# Patient Record
Sex: Male | Born: 1990 | Race: White | Hispanic: No | Marital: Single | Smoking: Never smoker
Health system: Southern US, Community
[De-identification: ages and names within clinical notes are randomized; demographics above are authoritative.]

---

## 2014-08-06 ENCOUNTER — Ambulatory Visit (INDEPENDENT_AMBULATORY_CARE_PROVIDER_SITE_OTHER): Payer: BLUE CROSS/BLUE SHIELD | Admitting: Neurology

## 2014-08-06 ENCOUNTER — Ambulatory Visit (INDEPENDENT_AMBULATORY_CARE_PROVIDER_SITE_OTHER): Payer: Self-pay | Admitting: Neurology

## 2014-08-06 DIAGNOSIS — R202 Paresthesia of skin: Secondary | ICD-10-CM

## 2014-08-06 DIAGNOSIS — R200 Anesthesia of skin: Secondary | ICD-10-CM

## 2014-08-06 NOTE — Progress Notes (Signed)
  [facility]    Provider:  Dr Lucia GaskinsAhern Referring Provider: Richardean Chimeraaniel, Terry G, MD Primary Care Physician:  No primary care provider on file.   HPI:  Shawn Ortega is a 24 y.o. male here as a referral from Dr. Reuel Boomaniel for numbness in the medial lower right leg in the are of the ankle.   Summary  Nerve conduction studies were performed on the right lower extremity:  The right Peroneal motor nerve showed normal conductions with normal F Wave latency The right Tibial motor nerve showed normal conductions with normal F Wave latency The right Sural sensory nerve conduction was within normal limits The right Peroneal sensory nerve conduction was within normal limits The right Saphenous sensory nerve conduction was within normal limits Bilateral H Reflexes showed normal latencies  EMG Needle study was performed on selected right lower extremity and paraspinal muscles:   The Gluteus Medius, Gluteus Maximus, Biceps Femoris (long and short heads), Vastus Medialis, Anterior Tibialis, Medial Gastrocnemius, Posterior Tibialis, Extensor Hallucis Longus, abductor digiti quinti, and Abductor Hallucis muscles, L5/S1 paraspinals were within normal limits.  Conclusion: This is a normal study. No electrophysiologic evidence for peripheral polyneuropathy, mononeuropathy or radiculopathy.   Naomie DeanAntonia Chan Rosasco, MD  [facility] [address] [place], [place] [id number]  Phone [phone] Fax [phone]

## 2014-08-06 NOTE — Procedures (Signed)
  [facility]    Provider:  Dr Ahern Referring Provider: Daniel, Terry G, MD Primary Care Physician:  No primary care provider on file.   HPI:  Shawn Ortega is a 24 y.o. male here as a referral from Dr. Daniel for numbness in the medial lower right leg in the are of the ankle.   Summary  Nerve conduction studies were performed on the right lower extremity:  The right Peroneal motor nerve showed normal conductions with normal F Wave latency The right Tibial motor nerve showed normal conductions with normal F Wave latency The right Sural sensory nerve conduction was within normal limits The right Peroneal sensory nerve conduction was within normal limits The right Saphenous sensory nerve conduction was within normal limits Bilateral H Reflexes showed normal latencies  EMG Needle study was performed on selected right lower extremity and paraspinal muscles:   The Gluteus Medius, Gluteus Maximus, Biceps Femoris (long and short heads), Vastus Medialis, Anterior Tibialis, Medial Gastrocnemius, Posterior Tibialis, Extensor Hallucis Longus, abductor digiti quinti, and Abductor Hallucis muscles, L5/S1 paraspinals were within normal limits.  Conclusion: This is a normal study. No electrophysiologic evidence for peripheral polyneuropathy, mononeuropathy or radiculopathy.   Antonia Ahern, MD  [facility] [address] [place], [place] [address]  Phone [phone] Fax [phone]  

## 2014-08-06 NOTE — Progress Notes (Signed)
See procedure note.

## 2019-10-12 ENCOUNTER — Encounter (HOSPITAL_COMMUNITY): Payer: Self-pay

## 2019-10-12 ENCOUNTER — Emergency Department (HOSPITAL_COMMUNITY): Payer: Worker's Compensation

## 2019-10-12 ENCOUNTER — Emergency Department (HOSPITAL_COMMUNITY)
Admission: EM | Admit: 2019-10-12 | Discharge: 2019-10-12 | Disposition: A | Discharge status: Home / Self Care | Payer: Worker's Compensation | Attending: Emergency Medicine | Admitting: Emergency Medicine

## 2019-10-12 DIAGNOSIS — Y929 Unspecified place or not applicable: Secondary | ICD-10-CM | POA: Diagnosis not present

## 2019-10-12 DIAGNOSIS — S20219A Contusion of unspecified front wall of thorax, initial encounter: Secondary | ICD-10-CM | POA: Diagnosis not present

## 2019-10-12 DIAGNOSIS — R0789 Other chest pain: Secondary | ICD-10-CM | POA: Insufficient documentation

## 2019-10-12 DIAGNOSIS — Y939 Activity, unspecified: Secondary | ICD-10-CM | POA: Insufficient documentation

## 2019-10-12 DIAGNOSIS — W11XXXA Fall on and from ladder, initial encounter: Secondary | ICD-10-CM | POA: Insufficient documentation

## 2019-10-12 DIAGNOSIS — S0990XA Unspecified injury of head, initial encounter: Secondary | ICD-10-CM | POA: Diagnosis not present

## 2019-10-12 DIAGNOSIS — Y999 Unspecified external cause status: Secondary | ICD-10-CM | POA: Diagnosis not present

## 2019-10-12 MED ORDER — IOHEXOL 300 MG/ML  SOLN
75.0000 mL | Freq: Once | INTRAMUSCULAR | Status: AC | PRN
  Administered 2019-10-12: 75 mL via INTRAVENOUS

## 2019-10-12 NOTE — Discharge Instructions (Signed)
Your x-rays are totally normal, no signs of fractures or trauma, Tylenol or ibuprofen for pain, rest for 24 hours, drink plenty liquids, emergency department for severe or worsening symptoms.

## 2019-10-12 NOTE — ED Triage Notes (Addendum)
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 Pt fell 10 ft off ladder. Reports nozzle got away and he grabbed it causing him to lose balance and fell off to right side No loss of consciousness. Reports pain in ribcage.Marland Kitchen Helmet broke has red areas on forehead and top of head

## 2019-10-12 NOTE — ED Provider Notes (Signed)
[facility] EMERGENCY DEPARTMENT Provider Note   CSN: [id number] Arrival date & time: 10/12/19  1102     History Chief Complaint  Patient presents with   Shawn Ortega is a 29 y.o. male.  HPI   This patient is a 29 year old male presenting after a fall off of a ladder from approximately 10 feet.  He fell backwards into the side landing primarily on his side but also striking his head significantly on the ground breaking the helmet that he was wearing.  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  He did not lose consciousness but the onlookers said that he was not quite himself, he was confused after the fall but now back to his normal self.  He complains of a headache, forehead pain, mid chest pain and side rib pain but no spine pain numbness or weakness, he was ambulatory at the scene and was brought here by private vehicle.  Ambulatory through the doors.  He declines pain medications, pain is worse with deep breathing and moving  History reviewed. No pertinent past medical history.  There are no problems to display for this patient.   History reviewed. No pertinent surgical history.     No family history on file.  Social History   Tobacco Use   Smoking status: Never Smoker   Smokeless tobacco: Current User    Types: Chew  Substance Use Topics   Alcohol use: Never   Drug use: Never    Home Medications Prior to Admission medications   Not on File    Allergies    Penicillins  Review of Systems   Review of Systems  All other systems reviewed and are negative.   Physical Exam Updated Vital Signs BP 112/62    Pulse 80    Temp 98.4 F (36.9 C) (Oral)    Resp 15    Ht 1.829 m (6')    Wt 69.4 kg    SpO2 99%    BMI 20.75 kg/m   Physical Exam Vitals and nursing note reviewed.  Constitutional:      General: He is not in acute distress.    Appearance: He is well-developed.  HENT:     Head: Normocephalic.      Comments: Bruising to the mid forehead    Mouth/Throat:     Pharynx: No oropharyngeal exudate.  Eyes:     General: No scleral icterus.       Right eye: No discharge.        Left eye: No discharge.     Conjunctiva/sclera: Conjunctivae normal.     Pupils: Pupils are equal, round, and reactive to light.  Neck:     Thyroid: No thyromegaly.     Vascular: No JVD.  Cardiovascular:     Rate and Rhythm: Normal rate and regular rhythm.     Heart sounds: Normal heart sounds. No murmur heard.  No friction rub. No gallop.   Pulmonary:     Effort: Pulmonary effort is normal. No respiratory distress.     Breath sounds: Normal breath sounds. No wheezing or rales.     Comments: Able to take a deep breath with normal lung sounds, has mild chest tenderness over the sternum and the bilateral ribs but no crepitance or subcutaneous emphysema Chest:     Chest wall: Tenderness present.  Abdominal:     General: Bowel sounds are normal. There is no distension.     Palpations:  Abdomen is soft. There is no mass.     Tenderness: There is no abdominal tenderness.     Comments: No bruising to the abdominal wall, no tenderness  Musculoskeletal:        General: No tenderness. Normal range of motion.     Cervical back: Normal range of motion and neck supple.     Comments: Soft compartments and supple joints diffusely  Lymphadenopathy:     Cervical: No cervical adenopathy.  Skin:    General: Skin is warm and dry.     Findings: No erythema or rash.  Neurological:     Mental Status: He is alert.     Coordination: Coordination normal.     Comments: Moves all 4 extremities with normal strength, normal sensation and coordination.  Cranial nerves III through XII are normal  Psychiatric:        Behavior: Behavior normal.     ED Results / Procedures / Treatments   Labs (all labs ordered are listed, but only abnormal results are displayed) Labs Reviewed - No data to display  EKG None  Radiology CT Head Wo  Contrast  Result Date: 10/12/2019 CLINICAL DATA:  Pain following fall from roof EXAM: CT HEAD WITHOUT CONTRAST CT CERVICAL SPINE WITHOUT CONTRAST TECHNIQUE: Multidetector CT imaging of the head and cervical spine was performed following the standard protocol without intravenous contrast. Multiplanar CT image reconstructions of the cervical spine were also generated. COMPARISON:  None. FINDINGS: CT HEAD FINDINGS Brain: Ventricles and sulci are normal in size and configuration. There is no intracranial mass, hemorrhage, extra-axial fluid collection, or midline shift. The brain parenchyma appears unremarkable. No acute infarct evident. Vascular: No hyperdense vessel.  No evident vascular calcification. Skull: Bony calvarium appears intact. Sinuses/Orbits: There is opacification in a left ethmoid air cell. Other visualized paranasal sinuses are clear. Orbits appear symmetric bilaterally. Other: Mastoid air cells are clear. CT CERVICAL SPINE FINDINGS Alignment: There is no evidence spondylolisthesis. Skull base and vertebrae: Skull base and craniocervical junction regions appear normal. No fracture evident. No blastic or lytic bone lesions. Soft tissues and spinal canal: Prevertebral soft tissues and predental space regions are normal. There is no evident cord or canal hematoma. No paraspinous lesions are evident. Disc levels: The disc spaces appear unremarkable. No nerve root edema or effacement. No disc extrusion or stenosis. There is slight exit foraminal narrowing on the left at C2-3 due to bony hypertrophy. Upper chest: Visualized upper lung regions are clear. Other: None IMPRESSION: CT head: Mild ethmoid sinus disease.  Study otherwise unremarkable. CT cervical spine: No fracture or spondylolisthesis. No disc extrusion or stenosis. Exit foraminal narrowing on the left at C2-3 due to bony hypertrophy noted. Electronically Signed   By: Bretta Bang III M.D.   On: 10/12/2019 12:31   CT Chest W  Contrast  Result Date: 10/12/2019 CLINICAL DATA:  Firefighter, fall from ladder, rib pain and shortness of breath EXAM: CT CHEST WITH CONTRAST TECHNIQUE: Multidetector CT imaging of the chest was performed during intravenous contrast administration. CONTRAST:  64mL OMNIPAQUE IOHEXOL 300 MG/ML  SOLN COMPARISON:  None. FINDINGS: Cardiovascular: No significant vascular findings. Normal heart size. No pericardial effusion. Mediastinum/Nodes: No enlarged mediastinal, hilar, or axillary lymph nodes. Thyroid gland, trachea, and esophagus demonstrate no significant findings. Lungs/Pleura: Lungs are clear. No pleural effusion or pneumothorax. Upper Abdomen: No acute abnormality. Musculoskeletal: Slight curvature of the spine suggesting scoliosis. No acute osseous finding. No chest wall abnormality or soft tissue asymmetry. Intact sternum. IMPRESSION: No acute  intrathoracic injury or finding. Mild scoliosis. Electronically Signed   By: Jerilynn Mages.  Shick M.D.   On: 10/12/2019 12:31   CT Cervical Spine Wo Contrast  Result Date: 10/12/2019 CLINICAL DATA:  Pain following fall from roof EXAM: CT HEAD WITHOUT CONTRAST CT CERVICAL SPINE WITHOUT CONTRAST TECHNIQUE: Multidetector CT imaging of the head and cervical spine was performed following the standard protocol without intravenous contrast. Multiplanar CT image reconstructions of the cervical spine were also generated. COMPARISON:  None. FINDINGS: CT HEAD FINDINGS Brain: Ventricles and sulci are normal in size and configuration. There is no intracranial mass, hemorrhage, extra-axial fluid collection, or midline shift. The brain parenchyma appears unremarkable. No acute infarct evident. Vascular: No hyperdense vessel.  No evident vascular calcification. Skull: Bony calvarium appears intact. Sinuses/Orbits: There is opacification in a left ethmoid air cell. Other visualized paranasal sinuses are clear. Orbits appear symmetric bilaterally. Other: Mastoid air cells are clear. CT  CERVICAL SPINE FINDINGS Alignment: There is no evidence spondylolisthesis. Skull base and vertebrae: Skull base and craniocervical junction regions appear normal. No fracture evident. No blastic or lytic bone lesions. Soft tissues and spinal canal: Prevertebral soft tissues and predental space regions are normal. There is no evident cord or canal hematoma. No paraspinous lesions are evident. Disc levels: The disc spaces appear unremarkable. No nerve root edema or effacement. No disc extrusion or stenosis. There is slight exit foraminal narrowing on the left at C2-3 due to bony hypertrophy. Upper chest: Visualized upper lung regions are clear. Other: None IMPRESSION: CT head: Mild ethmoid sinus disease.  Study otherwise unremarkable. CT cervical spine: No fracture or spondylolisthesis. No disc extrusion or stenosis. Exit foraminal narrowing on the left at C2-3 due to bony hypertrophy noted. Electronically Signed   By: Lowella Grip III M.D.   On: 10/12/2019 12:31    Procedures Procedures (including critical care time)  Medications Ordered in ED Medications  iohexol (OMNIPAQUE) 300 MG/ML solution 75 mL (75 mLs Intravenous Contrast Given 10/12/19 1210)    ED Course  I have reviewed the triage vital signs and the nursing notes.  Pertinent labs & imaging results that were available during my care of the patient were reviewed by me and considered in my medical decision making (see chart for details).    MDM Rules/Calculators/A&P                          Imaging to rule out significant fracture or intracranial hemorrhage spinal injury or injury trauma to the ribs or sternum.  The patient is agreeable, vital signs are reassuring, mechanism suggest there could be underlying injury despite the patient's stoic appearance.  He declines pain medication  X-rays unremarkable including no signs of trauma to the chest the cervical spine of the head, the patient is stable for discharge on an  anti-inflammatory, reassurance given  Final Clinical Impression(s) / ED Diagnoses Final diagnoses:  Minor head injury, initial encounter  Fall from ladder, initial encounter  Contusion of chest wall, unspecified laterality, initial encounter    Rx / DC Orders ED Discharge Orders    None       Noemi Chapel, MD 10/12/19 1322

## 2020-10-17 ENCOUNTER — Encounter (HOSPITAL_COMMUNITY): Payer: Self-pay | Admitting: Emergency Medicine

## 2020-10-17 ENCOUNTER — Other Ambulatory Visit: Payer: Self-pay

## 2020-10-17 ENCOUNTER — Emergency Department (HOSPITAL_COMMUNITY): Payer: No Typology Code available for payment source

## 2020-10-17 ENCOUNTER — Emergency Department (HOSPITAL_COMMUNITY)
Admission: EM | Admit: 2020-10-17 | Discharge: 2020-10-17 | Disposition: A | Discharge status: Home / Self Care | Payer: No Typology Code available for payment source | Attending: Emergency Medicine | Admitting: Emergency Medicine

## 2020-10-17 DIAGNOSIS — R072 Precordial pain: Secondary | ICD-10-CM | POA: Insufficient documentation

## 2020-10-17 DIAGNOSIS — R112 Nausea with vomiting, unspecified: Secondary | ICD-10-CM | POA: Diagnosis not present

## 2020-10-17 DIAGNOSIS — Z20822 Contact with and (suspected) exposure to covid-19: Secondary | ICD-10-CM | POA: Diagnosis not present

## 2020-10-17 DIAGNOSIS — F17220 Nicotine dependence, chewing tobacco, uncomplicated: Secondary | ICD-10-CM | POA: Diagnosis not present

## 2020-10-17 DIAGNOSIS — R42 Dizziness and giddiness: Secondary | ICD-10-CM | POA: Diagnosis not present

## 2020-10-17 DIAGNOSIS — R0789 Other chest pain: Secondary | ICD-10-CM

## 2020-10-17 LAB — BASIC METABOLIC PANEL
Anion gap: 7 (ref 5–15)
BUN: 19 mg/dL (ref 6–20)
CO2: 27 mmol/L (ref 22–32)
Calcium: 9 mg/dL (ref 8.9–10.3)
Chloride: 105 mmol/L (ref 98–111)
Creatinine, Ser: 1.11 mg/dL (ref 0.61–1.24)
GFR, Estimated: >60 mL/min (ref >60)
Glucose, Bld: 91 mg/dL (ref 70–99)
Potassium: 3.9 mmol/L (ref 3.5–5.1)
Sodium: 139 mmol/L (ref 135–145)

## 2020-10-17 LAB — CBC WITH DIFFERENTIAL/PLATELET
Abs Immature Granulocytes: 0.02 10*3/uL (ref 0.00–0.07)
Basophils Absolute: 0 10*3/uL (ref 0.0–0.1)
Basophils Relative: 1 %
Eosinophils Absolute: 0 10*3/uL (ref 0.0–0.5)
Eosinophils Relative: 0 %
HCT: 42.6 % (ref 39.0–52.0)
Hemoglobin: 15.1 g/dL (ref 13.0–17.0)
Immature Granulocytes: 0 %
Lymphocytes Relative: 19 %
Lymphs Abs: 1 10*3/uL (ref 0.7–4.0)
MCH: 30.8 pg (ref 26.0–34.0)
MCHC: 35.4 g/dL (ref 30.0–36.0)
MCV: 86.8 fL (ref 80.0–100.0)
Monocytes Absolute: 0.4 10*3/uL (ref 0.1–1.0)
Monocytes Relative: 7 %
Neutro Abs: 3.8 10*3/uL (ref 1.7–7.7)
Neutrophils Relative %: 73 %
Platelets: 227 10*3/uL (ref 150–400)
RBC: 4.91 MIL/uL (ref 4.22–5.81)
RDW: 11.6 % (ref 11.5–15.5)
WBC: 5.3 10*3/uL (ref 4.0–10.5)
nRBC: 0 % (ref 0.0–0.2)

## 2020-10-17 LAB — RESP PANEL BY RT-PCR (FLU A&B, COVID) ARPGX2
Influenza A by PCR: NEGATIVE
Influenza B by PCR: NEGATIVE
SARS Coronavirus 2 by RT PCR: NEGATIVE

## 2020-10-17 LAB — TROPONIN I (HIGH SENSITIVITY)
Troponin I (High Sensitivity): <2 ng/L (ref <18)
Troponin I (High Sensitivity): <2 ng/L (ref <18)

## 2020-10-17 MED ORDER — LIDOCAINE VISCOUS HCL 2 % MT SOLN
15.0000 mL | Freq: Once | OROMUCOSAL | Status: AC
  Administered 2020-10-17: 15 mL via ORAL
  Filled 2020-10-17: qty 15

## 2020-10-17 MED ORDER — ALUM & MAG HYDROXIDE-SIMETH 200-200-20 MG/5ML PO SUSP
30.0000 mL | Freq: Once | ORAL | Status: AC
  Administered 2020-10-17: 30 mL via ORAL
  Filled 2020-10-17: qty 30

## 2020-10-17 MED ORDER — OMEPRAZOLE 20 MG PO CPDR
20.0000 mg | DELAYED_RELEASE_CAPSULE | Freq: Every day | ORAL | 0 refills | Status: AC
Start: 2020-10-17 — End: 2020-11-16

## 2020-10-17 NOTE — ED Provider Notes (Signed)
[facility] EMERGENCY DEPARTMENT Provider Note   CSN: [id number] Arrival date & time: 10/17/20  1343     History Chief Complaint  Patient presents with   Chest Pain    Shawn Ortega is a 30 y.o. male.  HPI  Patient with no significant medical history presents to the emergency department with chief complaint of intermittent chest pain.  Patient states pain started suddenly last night, describes it as a dull sensation which comes and goes, states that it last approximately 30 to 60 seconds and then resolve on its own, states it tends to get worse with eating or drinking, states he will feel his chest tightening up.  He states that chest pain is substernal, does not radiate.  He denies becoming short of breath, becoming diaphoretic, having associated nausea, vomiting, lightheadedness with these chest pain.  He denies history of DVTs or PEs, currently not on hormone therapy, he denies  cardiac history, he does not smoke, does not do illicit drug use, he is a nondiabetic.  States never had this in the past, he denies alleviating factors.  Patient denies headaches, fevers, chills, shortness of breath, abdominal pain, pedal edema.  History reviewed. No pertinent past medical history.  There are no problems to display for this patient.   History reviewed. No pertinent surgical history.     History reviewed. No pertinent family history.  Social History   Tobacco Use   Smoking status: Never   Smokeless tobacco: Current    Types: Chew  Vaping Use   Vaping Use: Never used  Substance Use Topics   Alcohol use: Never   Drug use: Never    Home Medications Prior to Admission medications   Medication Sig Start Date End Date Taking? Authorizing Provider  omeprazole (PRILOSEC) 20 MG capsule Take 1 capsule (20 mg total) by mouth daily. 10/17/20 11/16/20 Yes Carroll Sage, PA-C    Allergies    Penicillins  Review of Systems   Review of Systems  Constitutional:  Negative for  chills and fever.  HENT:  Negative for congestion.   Respiratory:  Negative for shortness of breath.   Cardiovascular:  Positive for chest pain. Negative for palpitations and leg swelling.  Gastrointestinal:  Negative for abdominal pain, diarrhea and vomiting.  Genitourinary:  Negative for enuresis.  Musculoskeletal:  Negative for back pain.  Skin:  Negative for rash.  Neurological:  Negative for dizziness.  Hematological:  Does not bruise/bleed easily.   Physical Exam Updated Vital Signs BP 122/78 (BP Location: Right Arm)   Pulse 74   Temp 98.7 F (37.1 C) (Oral)   Resp 15   Ht 5\' 11"  (1.803 m)   Wt 72.6 kg   SpO2 99%   BMI 22.32 kg/m   Physical Exam Vitals and nursing note reviewed.  Constitutional:      General: He is not in acute distress.    Appearance: He is not ill-appearing.  HENT:     Head: Normocephalic and atraumatic.     Nose: No congestion.  Eyes:     Conjunctiva/sclera: Conjunctivae normal.  Cardiovascular:     Rate and Rhythm: Normal rate and regular rhythm.     Pulses: Normal pulses.     Heart sounds: No murmur heard.   No friction rub. No gallop.     Comments: Chest was palpated cannot reproduce chest pain. Pulmonary:     Effort: No respiratory distress.     Breath sounds: No wheezing, rhonchi or rales.  Abdominal:  Palpations: Abdomen is soft.     Tenderness: There is abdominal tenderness. There is no right CVA tenderness or left CVA tenderness.     Comments: Abdomen was visualized nondistended, no active bowel sounds, dull to percussion, he had noted epigastric pain no rebound tenderness, guarding, peritoneal sign.  Musculoskeletal:     Right lower leg: No edema.     Left lower leg: No edema.  Skin:    General: Skin is warm and dry.  Neurological:     Mental Status: He is alert.  Psychiatric:        Mood and Affect: Mood normal.    ED Results / Procedures / Treatments   Labs (all labs ordered are listed, but only abnormal results are  displayed) Labs Reviewed  RESP PANEL BY RT-PCR (FLU A&B, COVID) [id number]  BASIC METABOLIC PANEL  CBC WITH DIFFERENTIAL/PLATELET  TROPONIN I (HIGH SENSITIVITY)    EKG EKG Interpretation  Date/Time:  Saturday October 17 2020 14:11:34 EDT Ventricular Rate:  81 PR Interval:  128 QRS Duration: 100 QT Interval:  330 QTC Calculation: 383 R Axis:   69 Text Interpretation: Normal sinus rhythm with sinus arrhythmia Normal ECG Confirmed by Eber Hong ([id number]) on 10/17/2020 3:19:41 PM  Radiology DG Chest Port 1 View  Result Date: 10/17/2020 CLINICAL DATA:  Chest pain EXAM: PORTABLE CHEST 1 VIEW COMPARISON:  Chest CT 10/12/2019 FINDINGS: The heart size and mediastinal contours are within normal limits. Both lungs are clear. The visualized skeletal structures are unremarkable. IMPRESSION: No active disease. Electronically Signed   By: Marlan Palau M.D.   On: 10/17/2020 15:15    Procedures Procedures   Medications Ordered in ED Medications  alum & mag hydroxide-simeth (MAALOX/MYLANTA) 200-200-20 MG/5ML suspension 30 mL (30 mLs Oral Given 10/17/20 1506)    And  lidocaine (XYLOCAINE) 2 % viscous mouth solution 15 mL (15 mLs Oral Given 10/17/20 1506)    ED Course  I have reviewed the triage vital signs and the nursing notes.  Pertinent labs & imaging results that were available during my care of the patient were reviewed by me and considered in my medical decision making (see chart for details).    MDM Rules/Calculators/A&P                         Initial impression-patient presents with intermittent chest pain.  He is alert, does not appear in acute distress, vital signs reassuring.  Suspect GERD.  Will obtain basic lab work-up, provide GI cocktail and reassess.  Work-up-CBC unremarkable, BMP unremarkable, respiratory panel negative, troponin less than 2, chest x-ray unremarkable, EKG sinus without signs of ischemia  Reassessment-patient was reassessed states she is feeling better after GI  cocktail, vital signs remained stable, patient has no complaints this time.  Patient is agreeable for discharge.  Rule out- I have low suspicion for ACS as history is atypical, patient has no cardiac history, EKG was sinus rhythm without signs of ischemia, first troponin is less than 2, will defer second troponin as patient has been having chest pain for greater than 12 hours, would expect elevation in troponin if ACS was present.  Low suspicion for PE as patient denies pleuritic chest pain, shortness of breath, patient denies leg pain, no pedal edema noted on exam, patient was PERC negative.  Low suspicion for AAA or aortic dissection as history is atypical, patient has low risk factors.  Low suspicion for CHF exacerbation as lung sounds were clear bilaterally, chest  x-ray negative, no signs of fluid overload present my exam.  Low suspicion for systemic infection as patient is nontoxic-appearing, vital signs reassuring, no obvious source infection noted on exam.   Plan-  Chest pain resolved-I suspect secondary due to acid reflux, possible he might be suffering from esophagus spasms, cervical angina, costochondritis.  Will start on a PPI, follow-up with PCP for further evaluation.  Vital signs have remained stable, no indication for hospital admission.  Patient given at home care as well strict return precautions.  Patient verbalized that they understood agreed to said plan.  Final Clinical Impression(s) / ED Diagnoses Final diagnoses:  Atypical chest pain    Rx / DC Orders ED Discharge Orders          Ordered    omeprazole (PRILOSEC) 20 MG capsule  Daily        10/17/20 1614             Barnie Del 10/17/20 1616    Eber Hong, MD 10/18/20 [phone]

## 2020-10-17 NOTE — Discharge Instructions (Addendum)
Lab Work and imaging were reassuring.  I suspect you maybe suffering from acid reflux, starting you on an acid pill please take as prescribed.  Also given you a healthy diet to help prevent future acid reflux flareups.  Please follow-up with your PCP as needed.  Come back to the emergency department if you develop chest pain, shortness of breath, severe abdominal pain, uncontrolled nausea, vomiting, diarrhea.

## 2020-10-17 NOTE — ED Triage Notes (Addendum)
Patient c/o intermittent, mid-sternal chest pain that started last night while laying in bed. Per patient pain does not radiating. Denies any shortness of breath, nausea, vomiting, or dizziness. Denies any cough or fevers. Per patient has generalized weakness. Denies any cardiac hx. Patient took x2 tylenol this morning at 5:45am with no relief.

## 2021-04-18 ENCOUNTER — Emergency Department (HOSPITAL_COMMUNITY)
Admission: EM | Admit: 2021-04-18 | Discharge: 2021-04-18 | Disposition: A | Discharge status: Home / Self Care | Payer: No Typology Code available for payment source | Attending: Emergency Medicine | Admitting: Emergency Medicine

## 2021-04-18 ENCOUNTER — Encounter (HOSPITAL_COMMUNITY): Payer: Self-pay | Admitting: *Deleted

## 2021-04-18 DIAGNOSIS — R1114 Bilious vomiting: Secondary | ICD-10-CM | POA: Diagnosis not present

## 2021-04-18 DIAGNOSIS — R1110 Vomiting, unspecified: Secondary | ICD-10-CM | POA: Diagnosis present

## 2021-04-18 DIAGNOSIS — R110 Nausea: Secondary | ICD-10-CM | POA: Insufficient documentation

## 2021-04-18 LAB — COMPREHENSIVE METABOLIC PANEL WITH GFR
ALT: 27 U/L (ref 0–44)
AST: 26 U/L (ref 15–41)
Albumin: 4.5 g/dL (ref 3.5–5.0)
Alkaline Phosphatase: 54 U/L (ref 38–126)
Anion gap: 10 (ref 5–15)
BUN: 17 mg/dL (ref 6–20)
CO2: 25 mmol/L (ref 22–32)
Calcium: 9.2 mg/dL (ref 8.9–10.3)
Chloride: 104 mmol/L (ref 98–111)
Creatinine, Ser: 1.2 mg/dL (ref 0.61–1.24)
GFR, Estimated: >60 mL/min
Glucose, Bld: 126 mg/dL — ABNORMAL HIGH (ref 70–99)
Potassium: 3.5 mmol/L (ref 3.5–5.1)
Sodium: 139 mmol/L (ref 135–145)
Total Bilirubin: 1.2 mg/dL (ref 0.3–1.2)
Total Protein: 6.9 g/dL (ref 6.5–8.1)

## 2021-04-18 LAB — CBC WITH DIFFERENTIAL/PLATELET
Abs Immature Granulocytes: 0.06 10*3/uL (ref 0.00–0.07)
Basophils Absolute: 0 10*3/uL (ref 0.0–0.1)
Basophils Relative: 0 %
Eosinophils Absolute: 0 10*3/uL (ref 0.0–0.5)
Eosinophils Relative: 0 %
HCT: 45.7 % (ref 39.0–52.0)
Hemoglobin: 16.2 g/dL (ref 13.0–17.0)
Immature Granulocytes: 1 %
Lymphocytes Relative: 3 %
Lymphs Abs: 0.3 10*3/uL — ABNORMAL LOW (ref 0.7–4.0)
MCH: 30.6 pg (ref 26.0–34.0)
MCHC: 35.4 g/dL (ref 30.0–36.0)
MCV: 86.2 fL (ref 80.0–100.0)
Monocytes Absolute: 0.5 10*3/uL (ref 0.1–1.0)
Monocytes Relative: 5 %
Neutro Abs: 8.8 10*3/uL — ABNORMAL HIGH (ref 1.7–7.7)
Neutrophils Relative %: 91 %
Platelets: 209 10*3/uL (ref 150–400)
RBC: 5.3 MIL/uL (ref 4.22–5.81)
RDW: 11.9 % (ref 11.5–15.5)
WBC: 9.7 10*3/uL (ref 4.0–10.5)
nRBC: 0 % (ref 0.0–0.2)

## 2021-04-18 LAB — URINALYSIS, ROUTINE W REFLEX MICROSCOPIC
Bacteria, UA: NONE SEEN
Bilirubin Urine: NEGATIVE
Glucose, UA: NEGATIVE mg/dL
Hgb urine dipstick: NEGATIVE
Ketones, ur: 5 mg/dL — AB
Leukocytes,Ua: NEGATIVE
Nitrite: NEGATIVE
Protein, ur: 30 mg/dL — AB
Specific Gravity, Urine: 1.024 (ref 1.005–1.030)
pH: 8 (ref 5.0–8.0)

## 2021-04-18 LAB — LIPASE, BLOOD: Lipase: 31 U/L (ref 11–51)

## 2021-04-18 MED ORDER — FAMOTIDINE IN NACL 20-0.9 MG/50ML-% IV SOLN
20.0000 mg | Freq: Once | INTRAVENOUS | Status: AC
  Administered 2021-04-18: 15:00:00 20 mg via INTRAVENOUS
  Filled 2021-04-18: qty 50

## 2021-04-18 MED ORDER — ONDANSETRON HCL 4 MG/2ML IJ SOLN
4.0000 mg | Freq: Once | INTRAMUSCULAR | Status: AC
  Administered 2021-04-18: 15:00:00 4 mg via INTRAVENOUS
  Filled 2021-04-18: qty 2

## 2021-04-18 MED ORDER — ONDANSETRON HCL 4 MG PO TABS
4.0000 mg | ORAL_TABLET | Freq: Four times a day (QID) | ORAL | 0 refills | Status: AC
Start: 2021-04-18 — End: ?

## 2021-04-18 MED ORDER — SODIUM CHLORIDE 0.9 % IV BOLUS
1000.0000 mL | Freq: Once | INTRAVENOUS | Status: AC
  Administered 2021-04-18: 15:00:00 1000 mL via INTRAVENOUS

## 2021-04-18 NOTE — ED Provider Notes (Signed)
[facility] EMERGENCY DEPARTMENT Provider Note   CSN: [id number] Arrival date & time: 04/18/21  1320     History Chief Complaint  Patient presents with   Emesis    NUR KRASINSKI is a 30 y.o. male presenting for evaluation of a 12-hour history of uncontrolled vomiting.  He states he went to a Christmas dinner last night where he ate catered food which tasted fine, however around 11 PM last night he started vomiting, initially he vomited his dinner, after that he has been having yellow vomit, denies hematemesis, has been unable to keep any p.o. medications or fluids down since his symptoms began.  He reports having a similar episode in the past, approximately 7 months ago of unclear etiology.  He was seen for this problem at that time and put on an acid reducer, although he denies have any problems with GERD or acid reflux on a routine basis.  He denies fevers or chills, denies abdominal pain, back pain, diarrhea, dysuria, however he does state his last urine production was last night shortly after he started vomiting.  He has had no medications prior to arrival.  Denies EtOH use.  The history is provided by the patient.      History reviewed. No pertinent past medical history.  There are no problems to display for this patient.   History reviewed. No pertinent surgical history.     No family history on file.  Social History   Tobacco Use   Smoking status: Never   Smokeless tobacco: Current    Types: Chew  Vaping Use   Vaping Use: Never used  Substance Use Topics   Alcohol use: Never   Drug use: Never    Home Medications Prior to Admission medications   Medication Sig Start Date End Date Taking? Authorizing Provider  ondansetron (ZOFRAN) 4 MG tablet Take 1 tablet (4 mg total) by mouth every 6 (six) hours. 04/18/21  Yes Clemon Devaul, Raynelle Fanning, PA-C  omeprazole (PRILOSEC) 20 MG capsule Take 1 capsule (20 mg total) by mouth daily. 10/17/20 11/16/20  Carroll Sage, PA-C     Allergies    Penicillins  Review of Systems   Review of Systems  Constitutional:  Negative for chills and fever.  HENT:  Negative for congestion and sore throat.   Eyes: Negative.   Respiratory:  Negative for chest tightness and shortness of breath.   Cardiovascular:  Negative for chest pain.  Gastrointestinal:  Positive for nausea and vomiting. Negative for abdominal pain and diarrhea.  Genitourinary: Negative.   Musculoskeletal:  Negative for arthralgias, joint swelling and neck pain.  Skin: Negative.  Negative for rash and wound.  Neurological:  Negative for dizziness, weakness, light-headedness, numbness and headaches.  Psychiatric/Behavioral: Negative.    All other systems reviewed and are negative.  Physical Exam Updated Vital Signs BP 114/64    Pulse 85    Temp 98 F (36.7 C) (Oral)    Resp 18    Ht 6' (1.829 m)    Wt 62.1 kg    SpO2 98%    BMI 18.58 kg/m   Physical Exam Vitals and nursing note reviewed.  Constitutional:      Appearance: He is well-developed.  HENT:     Head: Normocephalic and atraumatic.     Mouth/Throat:     Mouth: Mucous membranes are moist.  Eyes:     Conjunctiva/sclera: Conjunctivae normal.  Cardiovascular:     Rate and Rhythm: Normal rate and regular rhythm.  Heart sounds: Normal heart sounds.  Pulmonary:     Effort: Pulmonary effort is normal.     Breath sounds: Normal breath sounds. No wheezing.  Abdominal:     General: Bowel sounds are normal. There is no distension.     Palpations: Abdomen is soft.     Tenderness: There is no abdominal tenderness. There is no guarding or rebound.  Musculoskeletal:        General: Normal range of motion.     Cervical back: Normal range of motion.  Skin:    General: Skin is warm and dry.  Neurological:     Mental Status: He is alert.    ED Results / Procedures / Treatments   Labs (all labs ordered are listed, but only abnormal results are displayed) Labs Reviewed  CBC WITH  DIFFERENTIAL/PLATELET - Abnormal; Notable for the following components:      Result Value   Neutro Abs 8.8 (*)    Lymphs Abs 0.3 (*)    All other components within normal limits  COMPREHENSIVE METABOLIC PANEL - Abnormal; Notable for the following components:   Glucose, Bld 126 (*)    All other components within normal limits  URINALYSIS, ROUTINE W REFLEX MICROSCOPIC - Abnormal; Notable for the following components:   Ketones, ur 5 (*)    Protein, ur 30 (*)    All other components within normal limits  LIPASE, BLOOD    EKG None  Radiology No results found.  Procedures Procedures   Medications Ordered in ED Medications  ondansetron (ZOFRAN) injection 4 mg (4 mg Intravenous Given 04/18/21 1443)  sodium chloride 0.9 % bolus 1,000 mL (0 mLs Intravenous Stopped 04/18/21 1524)  famotidine (PEPCID) IVPB 20 mg premix (0 mg Intravenous Stopped 04/18/21 1524)    ED Course  I have reviewed the triage vital signs and the nursing notes.  Pertinent labs & imaging results that were available during my care of the patient were reviewed by me and considered in my medical decision making (see chart for details).    MDM Rules/Calculators/A&P                         Labs reviewed and are stable, normal lipase, normal LFT's, wbc count is normal.  He was given IV fluids and zofran.  No further n/v here.  No change in abd exam findings, no ttp.  Patient was given a p.o. challenge which he passed.  Sx free at time of dc.  No c/o abd pain, specifically negative Eulah Pont, doubt gallbladder disease, doubt infection including acute appendicitis. Doubt food poisoning with no diarrhea sx.  He was given zofran, discussed strict return precautions for any return or worsening sx.  No history of gerd like sx, discussed antacids, pt defers, didn't feel they were helpful with this last episode.  He was advised close f/u with pcp and may need GI outpatient consult at some point if these episodes persist.      Final Clinical Impression(s) / ED Diagnoses Final diagnoses:  Bilious vomiting with nausea    Rx / DC Orders ED Discharge Orders          Ordered    ondansetron (ZOFRAN) 4 MG tablet  Every 6 hours        04/18/21 1603             Burgess Amor, PA-C 04/18/21 1604    Terrilee Files, MD 04/18/21 1745

## 2021-04-18 NOTE — ED Triage Notes (Signed)
Vomiting onset 12 hours ago

## 2021-04-18 NOTE — ED Notes (Signed)
  Tolerated fluid challenge well.  

## 2021-04-18 NOTE — Discharge Instructions (Signed)
Your labs are reassuring today.  Use the zofran if needed.  Get rechecked for return of uncontrolled vomiting, or you develop abdominal pain or fever.

## 2022-02-08 IMAGING — CT CT CERVICAL SPINE W/O CM
3 of 4 series · 12 of 33 positions shown, 14 images · non-contrast
Comparison: None.

CLINICAL DATA: Pain following fall from roof

EXAM:
CT HEAD WITHOUT CONTRAST
CT CERVICAL SPINE WITHOUT CONTRAST
TECHNIQUE: Multidetector CT imaging of the head and cervical spine was
performed following the standard protocol without intravenous
contrast. Multiplanar CT image reconstructions of the cervical spine
were also generated.

[Series 5: sagittal bone · sagittal · 0.23mm/px · 5 of 61 slices shown, 6 images]
[im 21/61  bone]
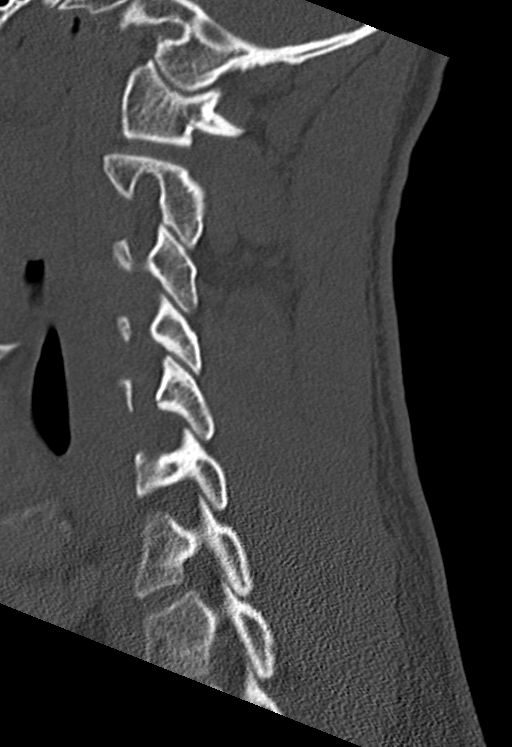
[im 26/61  bone]
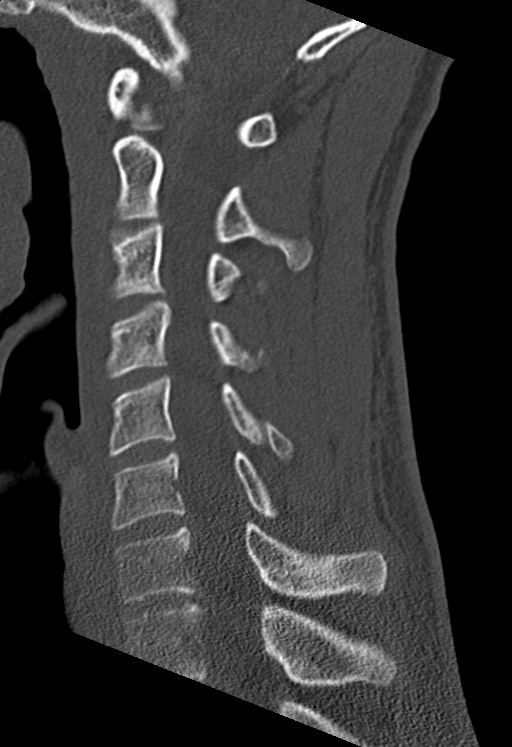
[im 31/61  soft-tissue]
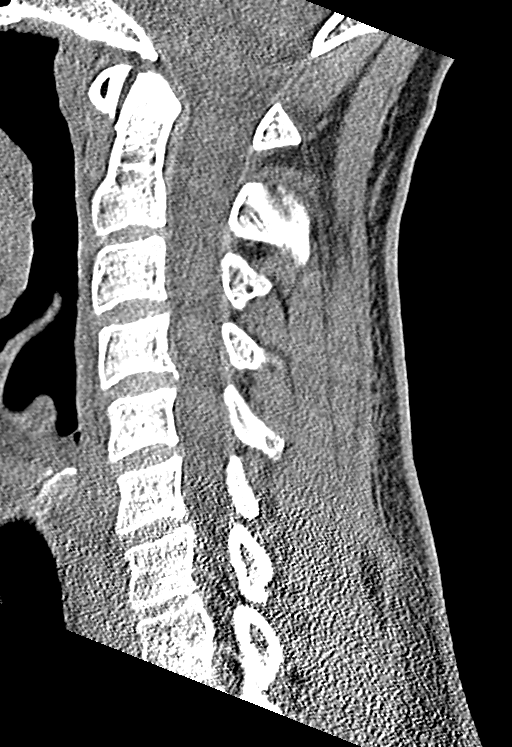
[im 31/61  bone]
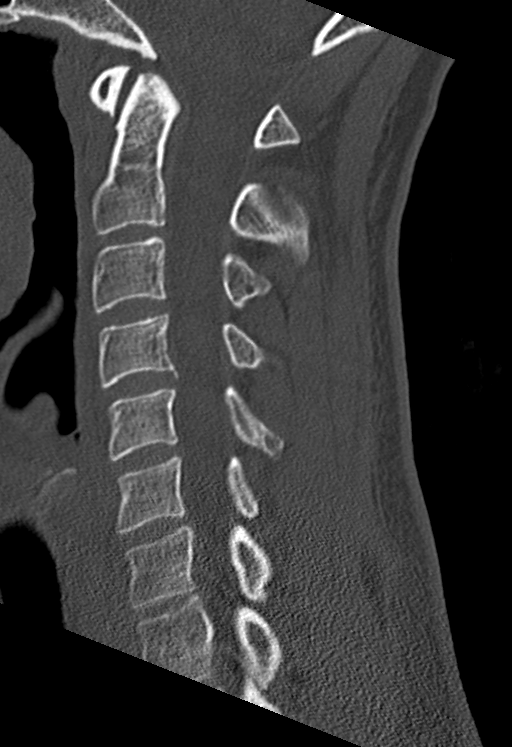
[im 36/61  bone]
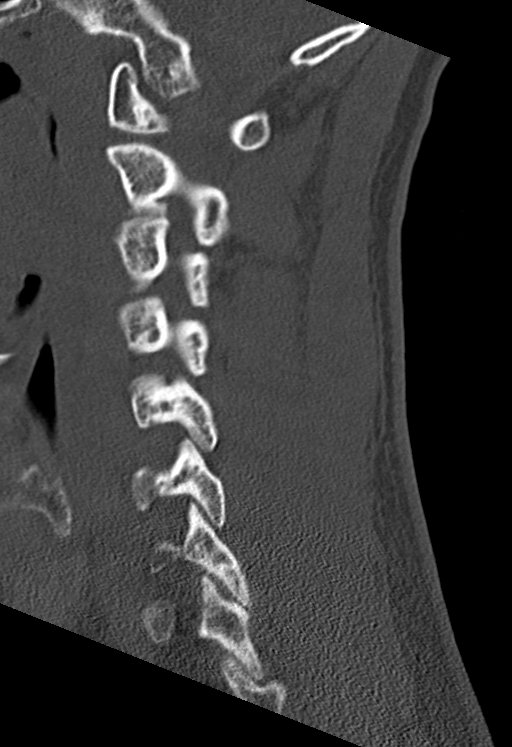
[im 41/61  bone]
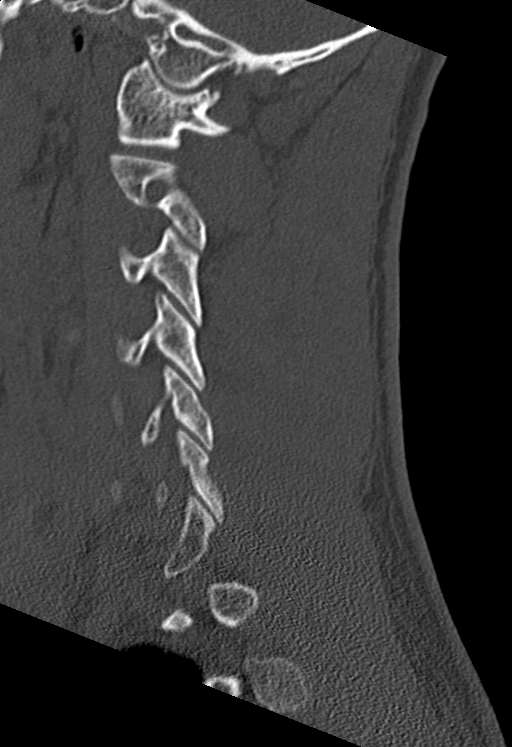

[Series 6: coronal bone · coronal · 0.23mm/px · 3 of 61 slices shown]
[im 13/61  bone]
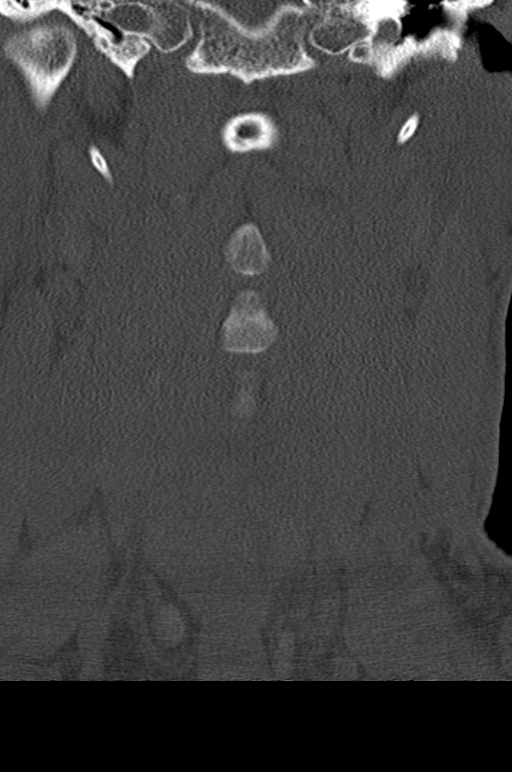
[im 25/61  bone]
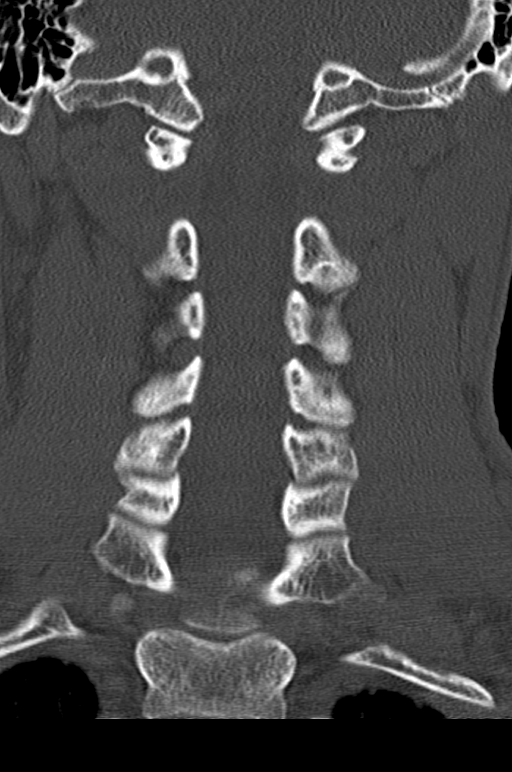
[im 37/61  bone]
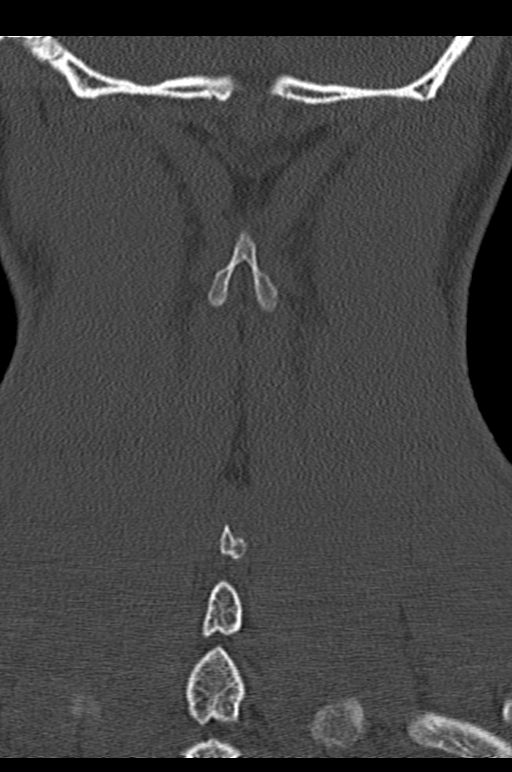

[Series 7: orthogonal axials · axial · 0.21mm/px · z∈[+1346,+1442]mm · 4 of 82 slices shown, 5 images]
[im 14/82  soft-tissue]
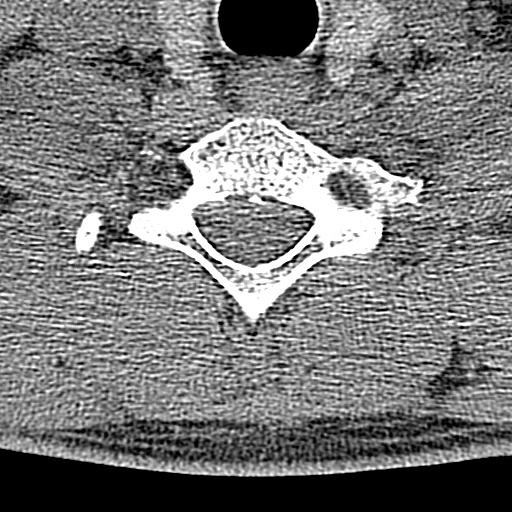
[im 14/82  bone]
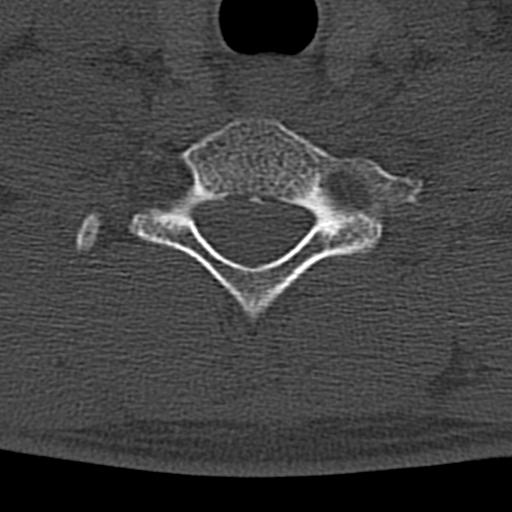
[im 28/82  bone]
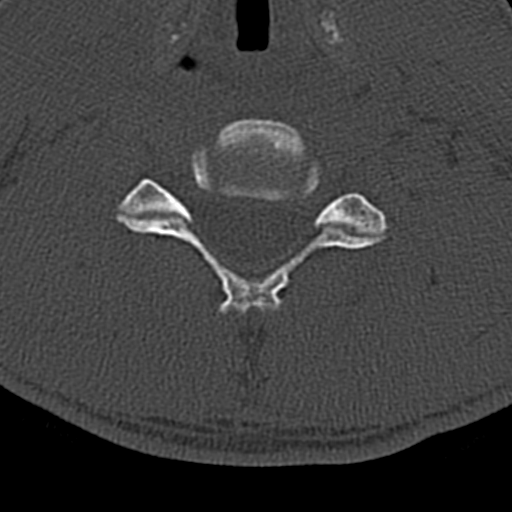
[im 55/82  bone]
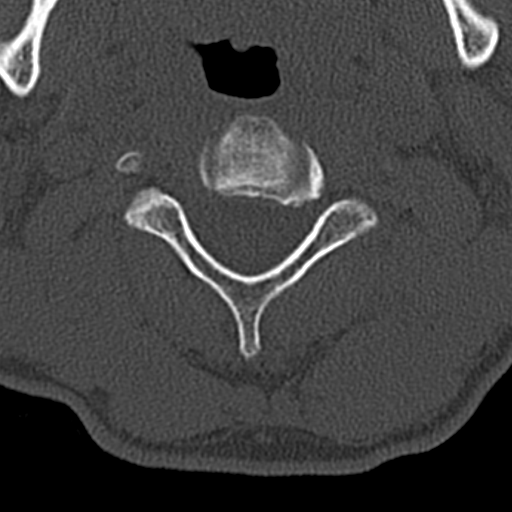
[im 68/82  bone]
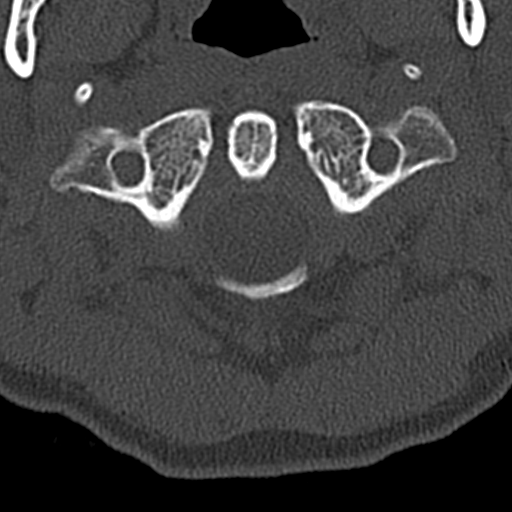

[12 of 33 positions shown; findings below may reference images not displayed]

FINDINGS: CT HEAD FINDINGS

Brain: Ventricles and sulci are normal in size and configuration.
There is no intracranial mass, hemorrhage, extra-axial fluid
collection, or midline shift. The brain parenchyma appears
unremarkable. No acute infarct evident.

Vascular: No hyperdense vessel.  No evident vascular calcification.

Skull: Bony calvarium appears intact.

Sinuses/Orbits: There is opacification in a left ethmoid air cell.
Other visualized paranasal sinuses are clear. Orbits appear
symmetric bilaterally.

Other: Mastoid air cells are clear.

CT CERVICAL SPINE FINDINGS

Alignment: There is no evidence spondylolisthesis.

Skull base and vertebrae: Skull base and craniocervical junction
regions appear normal. No fracture evident. No blastic or lytic bone
lesions.

Soft tissues and spinal canal: Prevertebral soft tissues and
predental space regions are normal. There is no evident cord or
canal hematoma. No paraspinous lesions are evident.

Disc levels: The disc spaces appear unremarkable. No nerve root
edema or effacement. No disc extrusion or stenosis. There is slight
exit foraminal narrowing on the left at C2-3 due to bony
hypertrophy.

Upper chest: Visualized upper lung regions are clear.

Other: None
IMPRESSION: CT head: Mild ethmoid sinus disease.  Study otherwise unremarkable.

CT cervical spine: No fracture or spondylolisthesis. No disc
extrusion or stenosis. Exit foraminal narrowing on the left at C2-3
due to bony hypertrophy noted.

## 2023-02-14 IMAGING — DX DG CHEST 1V PORT
1 series · 1 of 1 positions shown · non-contrast
Comparison: Chest CT 10/12/2019

CLINICAL DATA: Chest pain

EXAM:
PORTABLE CHEST 1 VIEW

[chest ap]
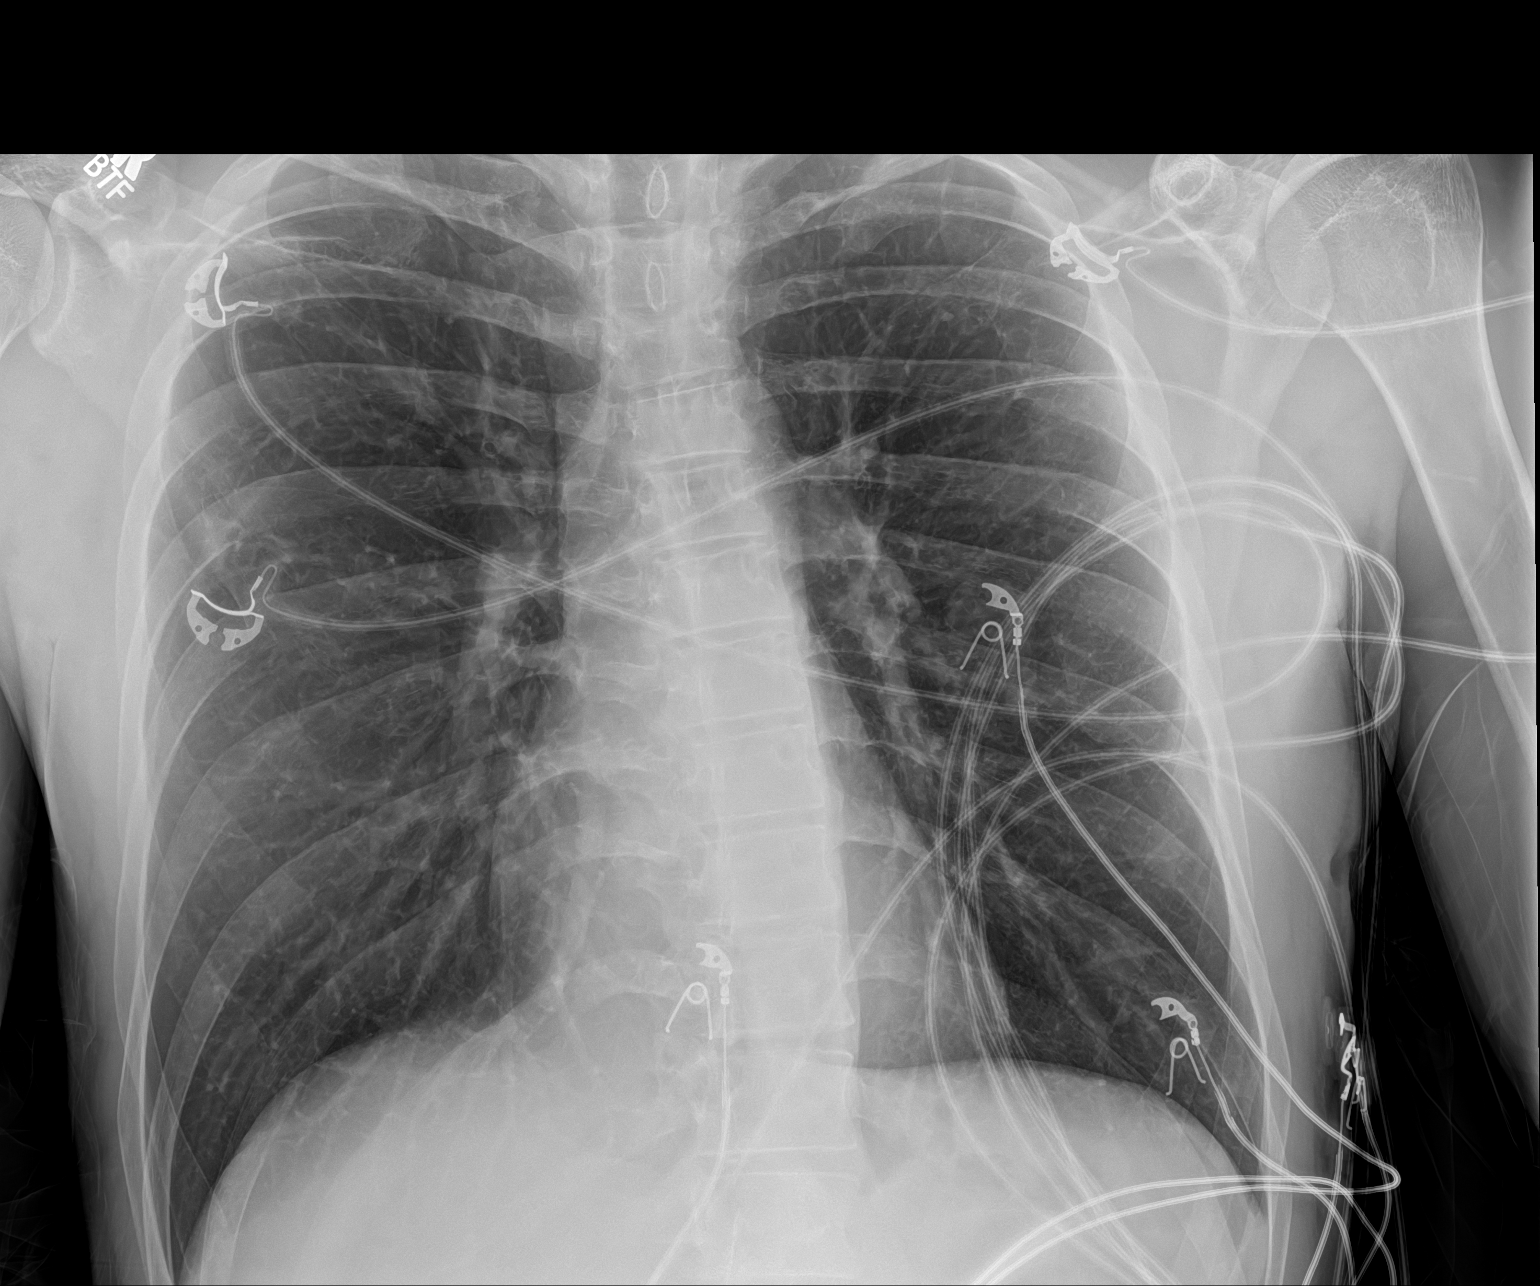

[1 of 1 positions shown; findings below may reference images not displayed]

FINDINGS: The heart size and mediastinal contours are within normal limits.
Both lungs are clear. The visualized skeletal structures are
unremarkable.
IMPRESSION: No active disease.
